# Patient Record
Sex: Male | Born: 1980 | Race: Black or African American | Hispanic: No | Marital: Single | Smoking: Never smoker
Health system: Southern US, Community
[De-identification: ages and names within clinical notes are randomized; demographics above are authoritative.]

## PROBLEM LIST (undated history)

## (undated) HISTORY — PX: WRIST SURGERY: SHX841

---

## 1999-05-17 ENCOUNTER — Emergency Department (HOSPITAL_COMMUNITY): Admission: EM | Admit: 1999-05-17 | Discharge: 1999-05-17 | Payer: Self-pay | Admitting: Emergency Medicine

## 1999-05-17 ENCOUNTER — Encounter: Payer: Self-pay | Admitting: Family Medicine

## 2000-11-25 ENCOUNTER — Encounter: Payer: Self-pay | Admitting: Orthopaedic Surgery

## 2000-11-25 ENCOUNTER — Ambulatory Visit (HOSPITAL_COMMUNITY): Admission: RE | Admit: 2000-11-25 | Discharge: 2000-11-25 | Payer: Self-pay | Admitting: Orthopaedic Surgery

## 2006-07-14 ENCOUNTER — Emergency Department (HOSPITAL_COMMUNITY): Admission: EM | Admit: 2006-07-14 | Discharge: 2006-07-14 | Payer: Self-pay | Admitting: Emergency Medicine

## 2006-09-19 ENCOUNTER — Ambulatory Visit (HOSPITAL_BASED_OUTPATIENT_CLINIC_OR_DEPARTMENT_OTHER): Admission: RE | Admit: 2006-09-19 | Discharge: 2006-09-19 | Payer: Self-pay | Admitting: Orthopedic Surgery

## 2008-08-18 ENCOUNTER — Emergency Department (HOSPITAL_COMMUNITY): Admission: EM | Admit: 2008-08-18 | Discharge: 2008-08-18 | Payer: Self-pay | Admitting: Family Medicine

## 2010-05-30 LAB — URINE CULTURE: Colony Count: NO GROWTH

## 2010-05-30 LAB — DIFFERENTIAL
Basophils Absolute: 0 10*3/uL (ref 0.0–0.1)
Basophils Relative: 0 % (ref 0–1)
Eosinophils Absolute: 0 10*3/uL (ref 0.0–0.7)
Monocytes Absolute: 0.7 10*3/uL (ref 0.1–1.0)
Monocytes Relative: 8 % (ref 3–12)
Neutro Abs: 7.2 10*3/uL (ref 1.7–7.7)

## 2010-05-30 LAB — CBC
HCT: 43.3 % (ref 39.0–52.0)
Hemoglobin: 14.5 g/dL (ref 13.0–17.0)
MCHC: 33.4 g/dL (ref 30.0–36.0)
MCV: 89.5 fL (ref 78.0–100.0)
Platelets: 197 10*3/uL (ref 150–400)
RBC: 4.84 MIL/uL (ref 4.22–5.81)
RDW: 13.8 % (ref 11.5–15.5)
WBC: 8.9 10*3/uL (ref 4.0–10.5)

## 2010-05-30 LAB — POCT URINALYSIS DIP (DEVICE)
Bilirubin Urine: NEGATIVE
Glucose, UA: NEGATIVE mg/dL
Hgb urine dipstick: NEGATIVE
Ketones, ur: NEGATIVE mg/dL
Nitrite: NEGATIVE
Protein, ur: 30 mg/dL — AB
Specific Gravity, Urine: 1.015 (ref 1.005–1.030)
Urobilinogen, UA: 1 mg/dL (ref 0.0–1.0)
pH: 7 (ref 5.0–8.0)

## 2010-05-30 LAB — POCT I-STAT, CHEM 8
Creatinine, Ser: 1.6 mg/dL — ABNORMAL HIGH (ref 0.4–1.5)
HCT: 46 % (ref 39.0–52.0)
Hemoglobin: 15.6 g/dL (ref 13.0–17.0)
Potassium: 4.3 mEq/L (ref 3.5–5.1)
Sodium: 136 mEq/L (ref 135–145)
TCO2: 29 mmol/L (ref 0–100)

## 2010-05-30 LAB — HEPATIC FUNCTION PANEL
Bilirubin, Direct: 0.1 mg/dL (ref 0.0–0.3)
Indirect Bilirubin: 0.5 mg/dL (ref 0.3–0.9)
Total Bilirubin: 0.6 mg/dL (ref 0.3–1.2)

## 2010-05-30 LAB — POCT RAPID STREP A (OFFICE): Streptococcus, Group A Screen (Direct): NEGATIVE
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
## 2010-12-05 LAB — POCT HEMOGLOBIN-HEMACUE: Operator id: 116011

## 2011-06-05 IMAGING — CR DG CHEST 2V
2 series · 2 of 2 positions shown · non-contrast
Comparison: None

CLINICAL DATA: Chest pain

CHEST - 2 VIEW

[view not recorded (1 of 2)]
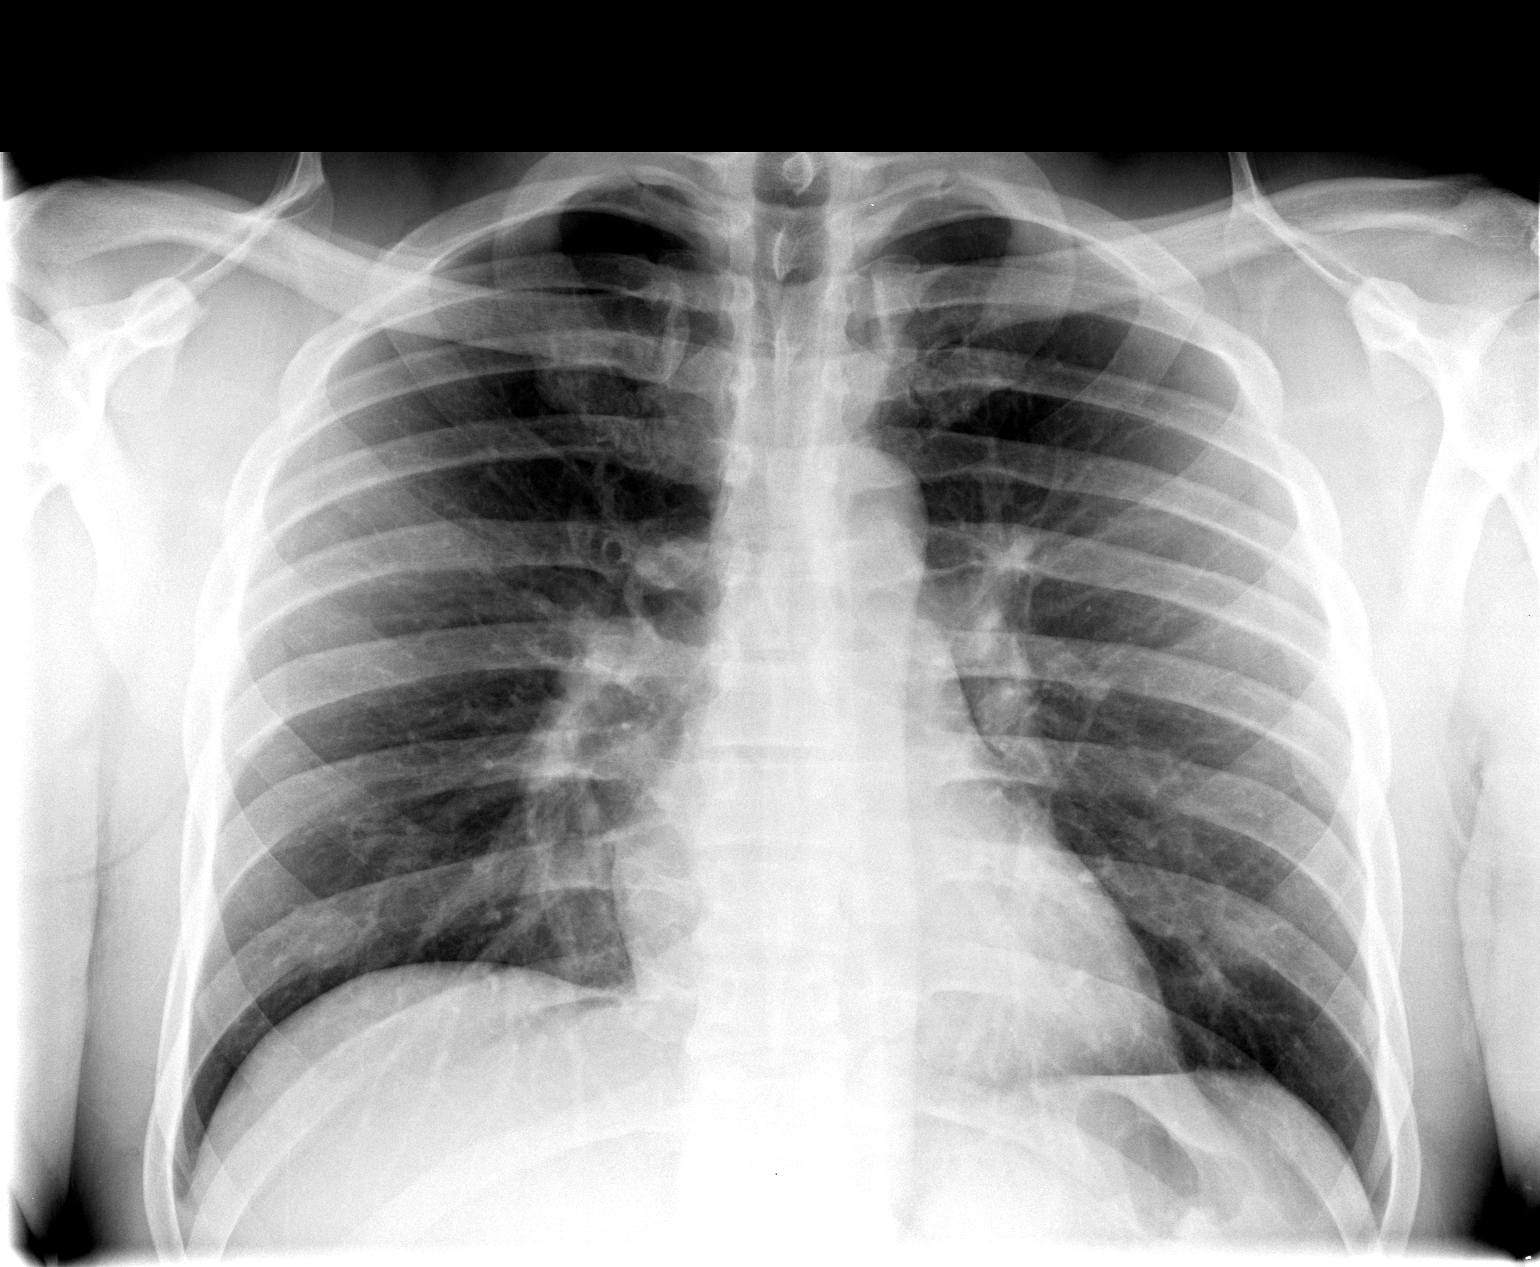

[view not recorded (2 of 2)]
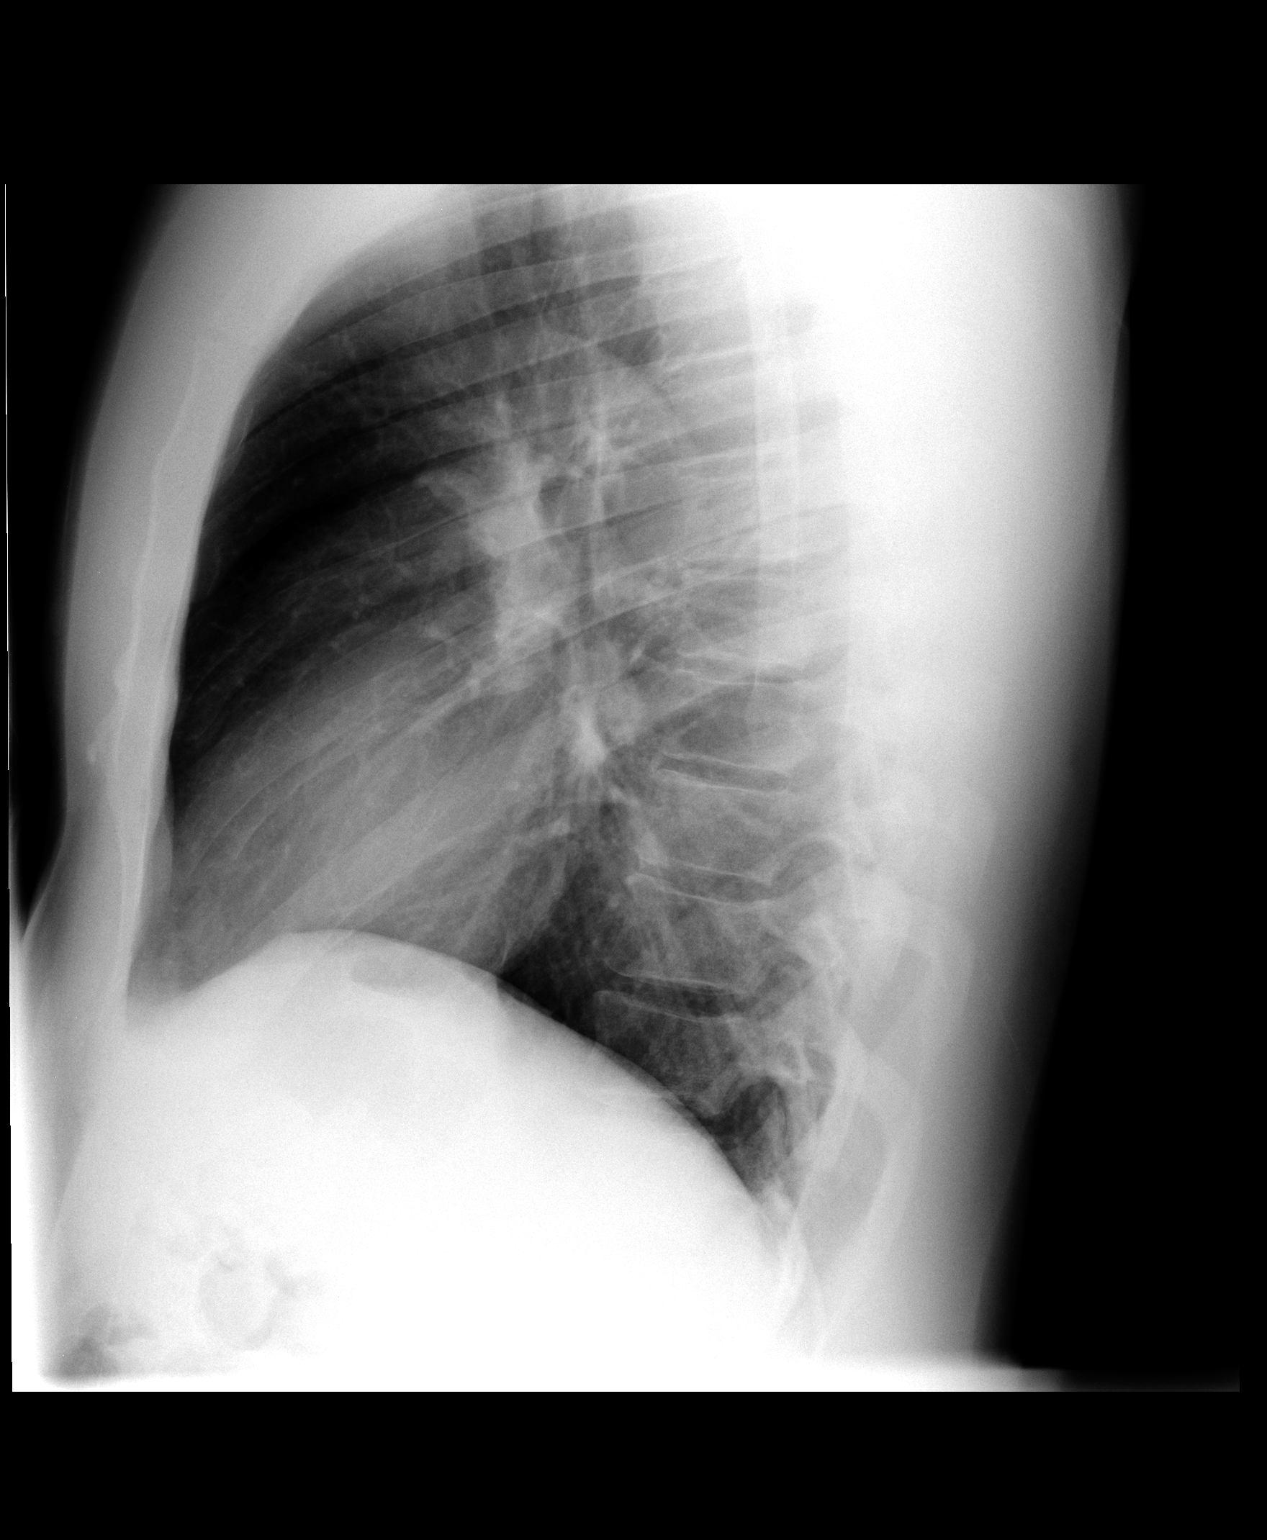

[2 of 2 positions shown; findings below may reference images not displayed]

FINDINGS: The heart size and mediastinal contours are within normal
limits.  Both lungs are clear.  The visualized skeletal structures
are unremarkable.
IMPRESSION: No acute cardiopulmonary abnormalities.

## 2012-01-05 ENCOUNTER — Ambulatory Visit
Admission: RE | Admit: 2012-01-05 | Discharge: 2012-01-05 | Disposition: A | Discharge status: Home / Self Care | Payer: 59 | Source: Ambulatory Visit

## 2012-01-05 ENCOUNTER — Other Ambulatory Visit: Payer: Self-pay

## 2012-01-05 DIAGNOSIS — M25532 Pain in left wrist: Secondary | ICD-10-CM
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
## 2016-04-24 ENCOUNTER — Emergency Department (HOSPITAL_COMMUNITY)
Admission: EM | Admit: 2016-04-24 | Discharge: 2016-04-24 | Disposition: A | Discharge status: Home / Self Care | Payer: Worker's Compensation | Attending: Emergency Medicine | Admitting: Emergency Medicine

## 2016-04-24 ENCOUNTER — Encounter (HOSPITAL_COMMUNITY): Payer: Self-pay | Admitting: *Deleted

## 2016-04-24 DIAGNOSIS — Y929 Unspecified place or not applicable: Secondary | ICD-10-CM | POA: Diagnosis not present

## 2016-04-24 DIAGNOSIS — S80211A Abrasion, right knee, initial encounter: Secondary | ICD-10-CM | POA: Diagnosis not present

## 2016-04-24 DIAGNOSIS — S60812A Abrasion of left wrist, initial encounter: Secondary | ICD-10-CM | POA: Diagnosis present

## 2016-04-24 DIAGNOSIS — Y999 Unspecified external cause status: Secondary | ICD-10-CM | POA: Diagnosis not present

## 2016-04-24 DIAGNOSIS — S50312A Abrasion of left elbow, initial encounter: Secondary | ICD-10-CM | POA: Diagnosis not present

## 2016-04-24 DIAGNOSIS — S80212A Abrasion, left knee, initial encounter: Secondary | ICD-10-CM | POA: Insufficient documentation

## 2016-04-24 DIAGNOSIS — Y939 Activity, unspecified: Secondary | ICD-10-CM | POA: Diagnosis not present

## 2016-04-24 DIAGNOSIS — T07XXXA Unspecified multiple injuries, initial encounter: Secondary | ICD-10-CM

## 2016-04-24 NOTE — ED Notes (Signed)
Please see triage note.  Pt did not want any xrays done at this time.  Pt ambulatory without difficulty.  No obvious deformity noted to bila knees, R elbow, or L wrist.
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
## 2017-03-29 DIAGNOSIS — M25561 Pain in right knee: Secondary | ICD-10-CM | POA: Diagnosis not present

## 2017-03-29 DIAGNOSIS — Z20828 Contact with and (suspected) exposure to other viral communicable diseases: Secondary | ICD-10-CM | POA: Diagnosis not present

## 2017-04-02 DIAGNOSIS — M25561 Pain in right knee: Secondary | ICD-10-CM | POA: Diagnosis not present

## 2017-06-07 DIAGNOSIS — Z13220 Encounter for screening for lipoid disorders: Secondary | ICD-10-CM | POA: Diagnosis not present

## 2017-06-07 DIAGNOSIS — Z0000 Encounter for general adult medical examination without abnormal findings: Secondary | ICD-10-CM | POA: Diagnosis not present

## 2017-06-07 DIAGNOSIS — Z131 Encounter for screening for diabetes mellitus: Secondary | ICD-10-CM | POA: Diagnosis not present

## 2020-08-19 ENCOUNTER — Other Ambulatory Visit: Payer: Self-pay

## 2020-08-19 ENCOUNTER — Emergency Department (HOSPITAL_COMMUNITY)
Admission: EM | Admit: 2020-08-19 | Discharge: 2020-08-19 | Disposition: A | Discharge status: Home / Self Care | Payer: No Typology Code available for payment source | Attending: Emergency Medicine | Admitting: Emergency Medicine

## 2020-08-19 DIAGNOSIS — M79605 Pain in left leg: Secondary | ICD-10-CM | POA: Diagnosis not present

## 2020-08-19 MED ORDER — NAPROXEN 500 MG PO TABS: 500.0000 mg | ORAL_TABLET | Freq: Two times a day (BID) | ORAL | 0 refills | Status: AC

## 2020-08-19 MED ORDER — METHOCARBAMOL 500 MG PO TABS: 500.0000 mg | ORAL_TABLET | Freq: Two times a day (BID) | ORAL | 0 refills | Status: AC

## 2020-08-19 NOTE — Discharge Instructions (Addendum)
It was a pleasure taking care of you today. As discussed, I suspect your pain is related to a muscular strain. I am sending you home with pain medication and a muscle relaxer.  Take as needed for pain.  Muscle relaxer can cause drowsiness so do not drive or operate machinery while on the medication.  Please follow-up with PCP if symptoms do not improve within the next week.  Return to the ER for new or worsening symptoms.

## 2020-08-19 NOTE — ED Notes (Signed)
Discharge instructions reviewed with the patient. The patient verbalized understanding of instructions. Patient discharged.  ?
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
## 2022-06-19 ENCOUNTER — Encounter (HOSPITAL_COMMUNITY): Payer: Self-pay | Admitting: Emergency Medicine

## 2022-06-19 ENCOUNTER — Emergency Department (HOSPITAL_COMMUNITY)
Admission: EM | Admit: 2022-06-19 | Discharge: 2022-06-19 | Disposition: A | Discharge status: Home / Self Care | Payer: No Typology Code available for payment source | Attending: Emergency Medicine | Admitting: Emergency Medicine

## 2022-06-19 ENCOUNTER — Other Ambulatory Visit: Payer: Self-pay

## 2022-06-19 DIAGNOSIS — R519 Headache, unspecified: Secondary | ICD-10-CM | POA: Diagnosis present

## 2022-06-19 DIAGNOSIS — Y92410 Unspecified street and highway as the place of occurrence of the external cause: Secondary | ICD-10-CM | POA: Diagnosis not present

## 2022-06-19 DIAGNOSIS — M542 Cervicalgia: Secondary | ICD-10-CM | POA: Diagnosis not present
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
